# Patient Record
Sex: Female | Born: 1960 | Race: White | Hispanic: No | Smoking: Never smoker
Health system: Southern US, Community
[De-identification: ages and names within clinical notes are randomized; demographics above are authoritative.]

## PROBLEM LIST (undated history)

## (undated) DIAGNOSIS — K259 Gastric ulcer, unspecified as acute or chronic, without hemorrhage or perforation: Secondary | ICD-10-CM

## (undated) DIAGNOSIS — E7800 Pure hypercholesterolemia, unspecified: Secondary | ICD-10-CM

## (undated) HISTORY — PX: CHOLECYSTECTOMY: SHX55

---

## 2017-01-18 ENCOUNTER — Emergency Department (HOSPITAL_BASED_OUTPATIENT_CLINIC_OR_DEPARTMENT_OTHER)
Admission: EM | Admit: 2017-01-18 | Discharge: 2017-01-18 | Disposition: A | Discharge status: Home / Self Care | Payer: 59 | Attending: Emergency Medicine | Admitting: Emergency Medicine

## 2017-01-18 ENCOUNTER — Emergency Department (HOSPITAL_BASED_OUTPATIENT_CLINIC_OR_DEPARTMENT_OTHER): Payer: 59

## 2017-01-18 ENCOUNTER — Encounter (HOSPITAL_BASED_OUTPATIENT_CLINIC_OR_DEPARTMENT_OTHER): Payer: Self-pay | Admitting: Emergency Medicine

## 2017-01-18 DIAGNOSIS — R42 Dizziness and giddiness: Secondary | ICD-10-CM | POA: Diagnosis not present

## 2017-01-18 DIAGNOSIS — R110 Nausea: Secondary | ICD-10-CM | POA: Insufficient documentation

## 2017-01-18 DIAGNOSIS — R079 Chest pain, unspecified: Secondary | ICD-10-CM | POA: Diagnosis not present

## 2017-01-18 DIAGNOSIS — R1013 Epigastric pain: Secondary | ICD-10-CM

## 2017-01-18 HISTORY — DX: Pure hypercholesterolemia, unspecified: E78.00

## 2017-01-18 HISTORY — DX: Gastric ulcer, unspecified as acute or chronic, without hemorrhage or perforation: K25.9

## 2017-01-18 LAB — LIPASE, BLOOD: Lipase: 21 U/L (ref 11–51)

## 2017-01-18 LAB — COMPREHENSIVE METABOLIC PANEL
ALBUMIN: 3.8 g/dL (ref 3.5–5.0)
ALT: 32 U/L (ref 14–54)
ANION GAP: 12 (ref 5–15)
AST: 46 U/L — ABNORMAL HIGH (ref 15–41)
Alkaline Phosphatase: 91 U/L (ref 38–126)
BUN: 13 mg/dL (ref 6–20)
CHLORIDE: 106 mmol/L (ref 101–111)
CO2: 19 mmol/L — AB (ref 22–32)
Calcium: 8.9 mg/dL (ref 8.9–10.3)
Creatinine, Ser: 0.74 mg/dL (ref 0.44–1.00)
GFR calc Af Amer: >60 mL/min (ref >60)
GFR calc non Af Amer: >60 mL/min (ref >60)
Glucose, Bld: 160 mg/dL — ABNORMAL HIGH (ref 65–99)
POTASSIUM: 2.9 mmol/L — AB (ref 3.5–5.1)
SODIUM: 137 mmol/L (ref 135–145)
Total Bilirubin: 0.2 mg/dL — ABNORMAL LOW (ref 0.3–1.2)
Total Protein: 6.8 g/dL (ref 6.5–8.1)

## 2017-01-18 LAB — CBC WITH DIFFERENTIAL/PLATELET
BASOS PCT: 0 %
Basophils Absolute: 0 10*3/uL (ref 0.0–0.1)
EOS ABS: 0.4 10*3/uL (ref 0.0–0.7)
EOS PCT: 5 %
HCT: 38.2 % (ref 36.0–46.0)
Hemoglobin: 12.7 g/dL (ref 12.0–15.0)
LYMPHS ABS: 1.9 10*3/uL (ref 0.7–4.0)
Lymphocytes Relative: 24 %
MCH: 29.2 pg (ref 26.0–34.0)
MCHC: 33.2 g/dL (ref 30.0–36.0)
MCV: 87.8 fL (ref 78.0–100.0)
MONOS PCT: 15 %
Monocytes Absolute: 1.2 10*3/uL — ABNORMAL HIGH (ref 0.1–1.0)
Neutro Abs: 4.4 10*3/uL (ref 1.7–7.7)
Neutrophils Relative %: 56 %
PLATELETS: 302 10*3/uL (ref 150–400)
RBC: 4.35 MIL/uL (ref 3.87–5.11)
RDW: 13.8 % (ref 11.5–15.5)
WBC: 8 10*3/uL (ref 4.0–10.5)

## 2017-01-18 LAB — TROPONIN I

## 2017-01-18 MED ORDER — POTASSIUM CHLORIDE ER 10 MEQ PO TBCR: 20.0000 meq | EXTENDED_RELEASE_TABLET | Freq: Every day | ORAL | 0 refills | Status: AC

## 2017-01-18 MED ORDER — GI COCKTAIL ~~LOC~~
30.0000 mL | Freq: Once | ORAL | Status: AC
  Administered 2017-01-18: 30 mL via ORAL
  Filled 2017-01-18: qty 30

## 2017-01-18 MED ORDER — PANTOPRAZOLE SODIUM 20 MG PO TBEC: 40.0000 mg | DELAYED_RELEASE_TABLET | Freq: Two times a day (BID) | ORAL | 0 refills | Status: AC

## 2017-01-18 MED ORDER — POTASSIUM CHLORIDE CRYS ER 20 MEQ PO TBCR
40.0000 meq | EXTENDED_RELEASE_TABLET | Freq: Once | ORAL | Status: AC
  Administered 2017-01-18: 40 meq via ORAL
  Filled 2017-01-18: qty 2

## 2017-01-18 NOTE — ED Notes (Signed)
Pt helped into wheelchair. Able to stand and pivot to wheelchair unassisted. Pt sweaty and holding her epigastric area, breathing heavily and rapidly. C/o pain

## 2017-01-18 NOTE — ED Notes (Signed)
ED Provider at bedside. 

## 2017-01-18 NOTE — ED Notes (Signed)
Patient denies pain and is resting comfortably.  

## 2017-01-18 NOTE — ED Notes (Signed)
Calmer, family at bedside

## 2017-01-18 NOTE — ED Triage Notes (Signed)
Epigastric pain since Thursday, denies n/v or diarrhea

## 2017-01-18 NOTE — ED Notes (Signed)
Patient transported to X-ray 

## 2017-01-18 NOTE — ED Provider Notes (Signed)
[facility]   CSN: [id number] Arrival date & time: 01/18/17  [id number]     History   Chief Complaint Chief Complaint  Patient presents with  . Abdominal Pain    HPI Julie Burke is a 56 y.o. female.  HPI   56yo female with history of hyperlipidemia, cholecystectomy, prior PUD, presents with concern for epigastric pain.  Reports pain began this morning at 430AM, sharp, epigastric and lower chest pain. No radiation.  Severe pain. Father had MI in his 7860s. No hx of smoking.  Nothing makes pain better or worse Not exertional. No shortness of breath. Nausea but no vomiting.  No diarrhea.  Last BM was Thursday. Not sure if pain related to gas, reports fear it is related to heart.   Past Medical History:  Diagnosis Date  . High cholesterol   . Stomach ulcer     There are no active problems to display for this patient.   Past Surgical History:  Procedure Laterality Date  . CHOLECYSTECTOMY      OB History    No data available       Home Medications    Prior to Admission medications   Medication Sig Start Date End Date Taking? Authorizing Provider  atorvastatin (LIPITOR) 40 MG tablet Take 40 mg by mouth daily.   Yes [provider]  pantoprazole (PROTONIX) 20 MG tablet Take 2 tablets (40 mg total) by mouth 2 (two) times daily. 01/18/17 02/01/17  Alvira MondaySchlossman, Gayna Braddy, MD  potassium chloride (K-DUR) 10 MEQ tablet Take 2 tablets (20 mEq total) by mouth daily. 01/18/17 01/21/17  Alvira MondaySchlossman, Cerina Leary, MD    Family History No family history on file.  Social History Social History  Substance Use Topics  . Smoking status: Never Smoker  . Smokeless tobacco: Never Used  . Alcohol use Yes     Comment: social     Allergies   Sulfa antibiotics   Review of Systems Review of Systems  Constitutional: Negative for fever. Diaphoresis: felt clammy.  HENT: Negative for sore throat.   Eyes: Negative for visual disturbance.  Respiratory: Negative for cough  and shortness of breath.   Cardiovascular: Positive for chest pain.  Gastrointestinal: Positive for abdominal pain and nausea. Negative for vomiting. Constipation: last BM Thursday.  Genitourinary: Negative for difficulty urinating.  Musculoskeletal: Negative for back pain and neck pain.  Skin: Negative for rash.  Neurological: Positive for light-headedness. Negative for syncope and headaches.     Physical Exam Updated Vital Signs BP 108/76   Pulse 70   Temp (!) 96.5 F (35.8 C) (Tympanic)   Resp 19   Ht 5\' 2"  (1.575 m)   Wt 182 lb (82.6 kg)   SpO2 100%   BMI 33.29 kg/m   Physical Exam  Constitutional: She is oriented to person, place, and time. She appears well-developed and well-nourished. No distress.  Anxious, tearful  HENT:  Head: Normocephalic and atraumatic.  Eyes: Conjunctivae and EOM are normal.  Neck: Normal range of motion.  Cardiovascular: Normal rate, regular rhythm, normal heart sounds and intact distal pulses.  Exam reveals no gallop and no friction rub.   No murmur heard. Pulmonary/Chest: Effort normal and breath sounds normal. No respiratory distress. She has no wheezes. She has no rales.  Abdominal: Soft. She exhibits no distension. There is tenderness (acknowledges epigastric tenderness when asked, otherwise no significant tenderness, no guarding). There is no guarding and no tenderness at McBurney's point.  Musculoskeletal: She exhibits no edema or tenderness.  Neurological: She is alert and oriented to person, place, and time.  Skin: Skin is warm and dry. No rash noted. She is not diaphoretic. No erythema.  Nursing note and vitals reviewed.    ED Treatments / Results  Labs (all labs ordered are listed, but only abnormal results are displayed) Labs Reviewed  CBC WITH DIFFERENTIAL/PLATELET - Abnormal; Notable for the following:       Result Value   Monocytes Absolute 1.2 (*)    All other components within normal limits  COMPREHENSIVE METABOLIC PANEL  - Abnormal; Notable for the following:    Potassium 2.9 (*)    CO2 19 (*)    Glucose, Bld 160 (*)    AST 46 (*)    Total Bilirubin 0.2 (*)    All other components within normal limits  LIPASE, BLOOD  TROPONIN I  TROPONIN I    EKG  EKG Interpretation  Date/Time:  Saturday Jan 18 2017 07:34:51 EDT Ventricular Rate:  84 PR Interval:    QRS Duration: 102 QT Interval:  419 QTC Calculation: 496 R Axis:   90 Text Interpretation:  Sinus rhythm Borderline right axis deviation Borderline repolarization abnormality Borderline prolonged QT interval No previous ECGs available Confirmed by [facility] MD, Ying Blankenhorn ([id number]) on 01/18/2017 7:37:01 AM       Radiology Dg Chest 2 View  Result Date: 01/18/2017 CLINICAL DATA:  56 year old female with lower chest and epigastric pain since Thursday. EXAM: CHEST  2 VIEW COMPARISON:  None. FINDINGS: The lungs are clear and negative for focal airspace consolidation, pulmonary edema or suspicious pulmonary nodule. No pleural effusion or pneumothorax. Cardiac and mediastinal contours are within normal limits. No acute fracture or lytic or blastic osseous lesions. The visualized upper abdominal bowel gas pattern is unremarkable. Surgical clips in the right upper quadrant suggest prior cholecystectomy. IMPRESSION: Negative chest x-ray. Electronically Signed   By: Malachy Moan M.D.   On: 01/18/2017 08:14    Procedures Procedures (including critical care time)  Medications Ordered in ED Medications  gi cocktail (Maalox,Lidocaine,Donnatal) (30 mLs Oral Given 01/18/17 0803)  potassium chloride SA (K-DUR,KLOR-CON) CR tablet 40 mEq (40 mEq Oral Given 01/18/17 0830)     Initial Impression / Assessment and Plan / ED Course  I have reviewed the triage vital signs and the nursing notes.  Pertinent labs & imaging results that were available during my care of the patient were reviewed by me and considered in my medical decision making (see chart for details).      56yo female with history of hyperlipidemia, cholecystectomy, prior PUD, presents with concern for epigastric pain. EKG with nonspecific repolarization abnormality.  Abdominal exam benign, doubt perforated ulcer, cholangitis, appendicitis. No associated dyspnea, location not consistent with PE, and no estrogen use/recent travel/leg pain or swelling. Equal pulses bilaterally, doubt dissection.    DDx includes pancreatitis, hepatitis, peptic ulcer disease, gastritis, constipation, myocardial ischemia. Given GI cocktail with improvement in symptoms. Chest XR WNL. Lipase WNL.  HEART score 3 and troponin x2 negative.  Given location, hx of prior PUD, improvement with GI cocktail overall suspect PUD or gastritis as most likely. Given family hx of CAD feel outpt cardiology evaluation is appropriate  Given rx for protonix, recommend PCP follow up.  Final Clinical Impressions(s) / ED Diagnoses   Final diagnoses:  Epigastric pain  Chest pain, unspecified type    New Prescriptions Discharge Medication List as of 01/18/2017 11:31 AM    START taking these medications   Details  pantoprazole (PROTONIX) 20  MG tablet Take 2 tablets (40 mg total) by mouth 2 (two) times daily., Starting Sat 01/18/2017, Until Sat 02/01/2017, Print         Alvira Monday, MD 01/18/17 2021

## 2017-01-18 NOTE — ED Notes (Signed)
Pt able to stand and pivot to lowered bed from wheelchair. RT in to listen to pt. Pt helped into hospital gown . RN called to room.

## 2021-11-08 ENCOUNTER — Emergency Department (HOSPITAL_BASED_OUTPATIENT_CLINIC_OR_DEPARTMENT_OTHER): Payer: BLUE CROSS/BLUE SHIELD

## 2021-11-08 ENCOUNTER — Encounter (HOSPITAL_BASED_OUTPATIENT_CLINIC_OR_DEPARTMENT_OTHER): Payer: Self-pay | Admitting: Emergency Medicine

## 2021-11-08 ENCOUNTER — Emergency Department (HOSPITAL_BASED_OUTPATIENT_CLINIC_OR_DEPARTMENT_OTHER)
Admission: EM | Admit: 2021-11-08 | Discharge: 2021-11-08 | Disposition: A | Discharge status: Home / Self Care | Payer: BLUE CROSS/BLUE SHIELD | Attending: Emergency Medicine | Admitting: Emergency Medicine

## 2021-11-08 ENCOUNTER — Other Ambulatory Visit: Payer: Self-pay

## 2021-11-08 DIAGNOSIS — M5136 Other intervertebral disc degeneration, lumbar region: Secondary | ICD-10-CM

## 2021-11-08 DIAGNOSIS — M5186 Other intervertebral disc disorders, lumbar region: Secondary | ICD-10-CM | POA: Diagnosis not present

## 2021-11-08 DIAGNOSIS — R739 Hyperglycemia, unspecified: Secondary | ICD-10-CM | POA: Diagnosis not present

## 2021-11-08 DIAGNOSIS — E876 Hypokalemia: Secondary | ICD-10-CM | POA: Diagnosis not present

## 2021-11-08 DIAGNOSIS — M549 Dorsalgia, unspecified: Secondary | ICD-10-CM | POA: Diagnosis present

## 2021-11-08 DIAGNOSIS — K219 Gastro-esophageal reflux disease without esophagitis: Secondary | ICD-10-CM | POA: Insufficient documentation

## 2021-11-08 LAB — CBC WITH DIFFERENTIAL/PLATELET
Abs Immature Granulocytes: 0.02 10*3/uL (ref 0.00–0.07)
Basophils Absolute: 0 10*3/uL (ref 0.0–0.1)
Basophils Relative: 1 %
Eosinophils Absolute: 0.2 10*3/uL (ref 0.0–0.5)
Eosinophils Relative: 3 %
HCT: 37.5 % (ref 36.0–46.0)
Hemoglobin: 12.1 g/dL (ref 12.0–15.0)
Immature Granulocytes: 0 %
Lymphocytes Relative: 19 %
Lymphs Abs: 1.5 10*3/uL (ref 0.7–4.0)
MCH: 29 pg (ref 26.0–34.0)
MCHC: 32.3 g/dL (ref 30.0–36.0)
MCV: 89.9 fL (ref 80.0–100.0)
Monocytes Absolute: 0.9 10*3/uL (ref 0.1–1.0)
Monocytes Relative: 12 %
Neutro Abs: 5.2 10*3/uL (ref 1.7–7.7)
Neutrophils Relative %: 65 %
Platelets: 304 10*3/uL (ref 150–400)
RBC: 4.17 MIL/uL (ref 3.87–5.11)
RDW: 13.3 % (ref 11.5–15.5)
WBC: 8 10*3/uL (ref 4.0–10.5)
nRBC: 0 % (ref 0.0–0.2)

## 2021-11-08 LAB — COMPREHENSIVE METABOLIC PANEL
ALT: 21 U/L (ref 0–44)
AST: 25 U/L (ref 15–41)
Albumin: 3.9 g/dL (ref 3.5–5.0)
Alkaline Phosphatase: 91 U/L (ref 38–126)
Anion gap: 8 (ref 5–15)
BUN: 15 mg/dL (ref 6–20)
CO2: 25 mmol/L (ref 22–32)
Calcium: 8.9 mg/dL (ref 8.9–10.3)
Chloride: 106 mmol/L (ref 98–111)
Creatinine, Ser: 0.82 mg/dL (ref 0.44–1.00)
GFR, Estimated: >60 mL/min (ref >60)
Glucose, Bld: 108 mg/dL — ABNORMAL HIGH (ref 70–99)
Potassium: 3.3 mmol/L — ABNORMAL LOW (ref 3.5–5.1)
Sodium: 139 mmol/L (ref 135–145)
Total Bilirubin: 0.6 mg/dL (ref 0.3–1.2)
Total Protein: 7.2 g/dL (ref 6.5–8.1)

## 2021-11-08 LAB — URINALYSIS, ROUTINE W REFLEX MICROSCOPIC
Bilirubin Urine: NEGATIVE
Glucose, UA: NEGATIVE mg/dL
Hgb urine dipstick: NEGATIVE
Ketones, ur: NEGATIVE mg/dL
Leukocytes,Ua: NEGATIVE
Nitrite: NEGATIVE
Protein, ur: NEGATIVE mg/dL
Specific Gravity, Urine: 1.01 (ref 1.005–1.030)
pH: 5.5 (ref 5.0–8.0)

## 2021-11-08 LAB — LIPASE, BLOOD: Lipase: 34 U/L (ref 11–51)

## 2021-11-08 LAB — MAGNESIUM: Magnesium: 2 mg/dL (ref 1.7–2.4)

## 2021-11-08 LAB — CBG MONITORING, ED: Glucose-Capillary: 132 mg/dL — ABNORMAL HIGH (ref 70–99)

## 2021-11-08 LAB — TROPONIN I (HIGH SENSITIVITY): Troponin I (High Sensitivity): 5 ng/L (ref <18)

## 2021-11-08 MED ORDER — METHOCARBAMOL 500 MG PO TABS: 500.0000 mg | ORAL_TABLET | Freq: Two times a day (BID) | ORAL | 0 refills | Status: AC

## 2021-11-08 MED ORDER — POTASSIUM CHLORIDE CRYS ER 20 MEQ PO TBCR
40.0000 meq | EXTENDED_RELEASE_TABLET | Freq: Once | ORAL | Status: AC
  Administered 2021-11-08: 40 meq via ORAL
  Filled 2021-11-08: qty 2

## 2021-11-08 NOTE — ED Notes (Signed)
Patient transported to X-ray 

## 2021-11-08 NOTE — Discharge Instructions (Addendum)
Take the medication as needed for your symptoms.  ?Follow up with your primary care provider. ?Return to the ED if you start to experience worsening symptoms, increase in pain, numbness, weakness, shortness of breath. ?Keep an eye out for a rash in the area of your back pain. ?

## 2021-11-08 NOTE — ED Triage Notes (Signed)
Pt states back pain and right foot feels tingling, woke up about 0100 3/2 with indigestion and sweating, took a Pepcid to feel better. States under more stress than usual . ?

## 2021-11-08 NOTE — ED Notes (Signed)
Patient discharged to home.  All discharge instructions reviewed.  Patient verbalized understanding via teachback method.  VS WDL.  Respirations even and unlabored.  Ambulatory out of ED.   °

## 2021-11-08 NOTE — ED Notes (Signed)
Patient ambulatory to bathroom.

## 2021-11-08 NOTE — ED Notes (Signed)
Urine specimen requested from patient.  ?

## 2021-11-08 NOTE — ED Provider Notes (Signed)
[facility] Provider Note   CSN: [id number] Arrival date & time: 11/08/21  2116     History  Chief Complaint  Patient presents with   Back Pain    Julie Burke is a 61 y.o. female with a past medical history of reflux, hyperlipidemia presenting to the ED with multiple complaints. #1 at approximately 1 AM today started experiencing indigestion, nausea and diaphoresis.  This improved with laying down and taking Pepcid.  She went to sleep and woke up but did not feel "100% normal."  She denies any classic chest pain, shortness of breath or cough.  She does note that when she has had bouts of indigestion in the past she does not typically have nausea or diaphoresis with it #2 she reports right sided back pain that is worse with movement.  No injury or trauma. #3 she reports paresthesias in bilateral lower extremities, right greater than left.  These are intermittent without specific aggravating or alleviating factor.  She does not feel that her extremities are swollen.  Denies any abdominal pain, vomiting, diarrhea.  She had her annual physical exam yesterday with her PCP and was given a tetanus booster.  HPI     Home Medications Prior to Admission medications   Medication Sig Start Date End Date Taking? Authorizing Provider  methocarbamol (ROBAXIN) 500 MG tablet Take 1 tablet (500 mg total) by mouth 2 (two) times daily. 11/08/21  Yes Sharonda Llamas, PA-C  atorvastatin (LIPITOR) 40 MG tablet Take 40 mg by mouth daily.    [provider]  pantoprazole (PROTONIX) 20 MG tablet Take 2 tablets (40 mg total) by mouth 2 (two) times daily. 01/18/17 02/01/17  Gareth Morgan, MD  potassium chloride (K-DUR) 10 MEQ tablet Take 2 tablets (20 mEq total) by mouth daily. 01/18/17 01/21/17  Gareth Morgan, MD      Allergies    Sulfa antibiotics    Review of Systems   Review of Systems  Constitutional:  Positive for diaphoresis. Negative for appetite change,  chills and fever.  HENT:  Negative for ear pain, rhinorrhea, sneezing and sore throat.   Eyes:  Negative for photophobia and visual disturbance.  Respiratory:  Negative for cough, chest tightness, shortness of breath and wheezing.   Cardiovascular:  Negative for chest pain and palpitations.  Gastrointestinal:  Positive for nausea. Negative for abdominal pain, blood in stool, constipation, diarrhea and vomiting.       +burning sensation in chest  Genitourinary:  Negative for dysuria, hematuria and urgency.  Musculoskeletal:  Positive for back pain. Negative for myalgias.  Skin:  Negative for rash.  Neurological:  Negative for dizziness, weakness and light-headedness.       +paresthesias in BLE   Physical Exam Updated Vital Signs BP (!) 142/73    Pulse 79    Temp 97.8 F (36.6 C) (Oral)    Resp 17    Ht 5\' 2"  (1.575 m)    Wt 86.2 kg    SpO2 99%    BMI 34.75 kg/m  Physical Exam Vitals and nursing note reviewed.  Constitutional:      General: She is not in acute distress.    Appearance: She is well-developed.  HENT:     Head: Normocephalic and atraumatic.     Nose: Nose normal.  Eyes:     General: No scleral icterus.       Right eye: No discharge.        Left eye: No discharge.  Conjunctiva/sclera: Conjunctivae normal.  Cardiovascular:     Rate and Rhythm: Normal rate and regular rhythm.     Heart sounds: Normal heart sounds. No murmur heard.   No friction rub. No gallop.  Pulmonary:     Effort: Pulmonary effort is normal. No respiratory distress.     Breath sounds: Normal breath sounds.  Abdominal:     General: Bowel sounds are normal. There is no distension.     Palpations: Abdomen is soft.     Tenderness: There is no abdominal tenderness. There is no right CVA tenderness, left CVA tenderness or guarding.     Comments: No CVA tenderness bilaterally.  Musculoskeletal:        General: Normal range of motion.     Cervical back: Normal range of motion and neck supple.      Comments: 2+ DP pulse palpated bilaterally.  No edema noted to bilateral lower extremities.  Normal sensation to light touch of bilateral lower extremities.  No calf tenderness bilaterally.  Strength 5/5 in bilateral upper and lower extremities.  Skin:    General: Skin is warm and dry.     Findings: No rash.  Neurological:     Mental Status: She is alert.     Motor: No abnormal muscle tone.     Coordination: Coordination normal.    ED Results / Procedures / Treatments   Labs (all labs ordered are listed, but only abnormal results are displayed) Labs Reviewed  COMPREHENSIVE METABOLIC PANEL - Abnormal; Notable for the following components:      Result Value   Potassium 3.3 (*)    Glucose, Bld 108 (*)    All other components within normal limits  CBG MONITORING, ED - Abnormal; Notable for the following components:   Glucose-Capillary 132 (*)    All other components within normal limits  LIPASE, BLOOD  CBC WITH DIFFERENTIAL/PLATELET  MAGNESIUM  URINALYSIS, ROUTINE W REFLEX MICROSCOPIC  TROPONIN I (HIGH SENSITIVITY)    EKG EKG Interpretation  Date/Time:  Thursday November 08 2021 21:55:07 EST Ventricular Rate:  76 PR Interval:  141 QRS Duration: 99 QT Interval:  387 QTC Calculation: 436 R Axis:   72 Text Interpretation: Sinus rhythm Atrial premature complex Probable inferior infarct, age indeterminate Similar to 2018 tracing Confirmed by Nanda Quinton [phone]) on 11/08/2021 10:48:20 PM  Radiology DG Chest 2 View  Result Date: 11/08/2021 CLINICAL DATA:  Back pain, diaphoresis, indigestion EXAM: CHEST - 2 VIEW COMPARISON:  11/12/2017 FINDINGS: Frontal and lateral views of the chest demonstrate an unremarkable cardiac silhouette. No airspace disease, effusion, or pneumothorax. No acute bony abnormalities. IMPRESSION: 1. No acute intrathoracic process. Electronically Signed   By: Randa Ngo M.D.   On: 11/08/2021 22:15   DG Lumbar Spine Complete  Result Date: 11/08/2021 CLINICAL  DATA:  Back pain right foot paresthesia EXAM: LUMBAR SPINE - COMPLETE 4+ VIEW COMPARISON:  None. FINDINGS: There is grade 1 anterolisthesis of L5-S1 with associated intervertebral disc space narrowing. Minimal retrolisthesis of L1-2 and L3-4 is likely degenerative in nature. No acute fracture of the lumbar spine. Vertebral body height is preserved. There is intervertebral disc space narrowing and endplate remodeling at 075-GRM, most severe at L1-2 in keeping with changes of mild to moderate degenerative disc disease. Oblique views do not optimally profiled the L5 pars interarticularis, however, bilateral pars defects are suspected. The paraspinal soft tissues are unremarkable. IMPRESSION: Degenerative disc disease throughout the lumbar spine with associated grade 1 anterolisthesis. Suspected bilateral L5 pars defects. Electronically  Signed   By: Fidela Salisbury M.D.   On: 11/08/2021 23:09    Procedures Procedures    Medications Ordered in ED Medications  potassium chloride SA (KLOR-CON M) CR tablet 40 mEq (40 mEq Oral Given 11/08/21 2302)    ED Course/ Medical Decision Making/ A&P Clinical Course as of 11/08/21 2330  Thu Nov 08, 2021  2158 Glucose-Capillary(!): 132 [HK]  2218 WBC: 8.0 [HK]  2218 Hemoglobin: 12.1 [HK]  2221 Potassium(!): 3.3 [HK]  2237 Magnesium: 2.0 [HK]  2243 Troponin I (High Sensitivity): 5 [HK]  2257 Leukocytes,Ua: NEGATIVE [HK]  2257 Nitrite: NEGATIVE [HK]    Clinical Course User Index [HK] Delia Heady, PA-C                           Medical Decision Making Amount and/or Complexity of Data Reviewed Labs: ordered. Decision-making details documented in ED Course. Radiology: ordered.   61 year old female with past medical history of GERD, hyperlipidemia presenting to the ED for multiple complaints.  Early this morning she woke up and felt a burning sensation in her chest, became diaphoretic.  This resolved with laying down and taking Pepcid.  She also reports  right-sided back pain and paresthesias in bilateral lower extremities, right greater than left.  This began today.  She does endorse increased stress in her life which she is unsure could be related to these symptoms.  On exam lungs are clear to auscultation bilaterally.  Palpation of the back reveals no tenderness in the midline or paraspinal musculature.  Lower extremities are neurovascularly intact with equal intact distal pulses bilaterally.  She has no neurological deficits on exam.  Will obtain lab work, EKG, chest x-ray and reassess.  EKG shows sinus rhythm, no changes from prior tracings.  Chest x-ray shows no acute findings.  Lab work ordered interpreted by me.  Unremarkable CBC, magnesium level.  Troponin is negative.  Potassium slightly low at 3.3 and this was repleted orally.  Lumbar x-ray done due to new symptom of back pain and shows degenerative changes.  This could be causing the paresthesias in her lower extremities. Doubt her symptoms are due to dissection, PE, CAD or other emergent cause based on her reassuring workup and physical exam findings.  There is no rash noted on the area of her back where she is experiencing pain but I have advised her to look out for this and considered shingles although no rash present at this time. She remains ambulatory here and hemodynamically stable. She is requesting discharge home and I feel that this is reasonable given her reassuring work-up today.  She knows to return for any worsening symptoms.  All imaging, if done today, including plain films, CT scans, and ultrasounds, independently reviewed by me, and interpretations confirmed via formal radiology reads.  Patient is hemodynamically stable, in NAD, and able to ambulate in the ED. Evaluation does not show pathology that would require ongoing emergent intervention or inpatient treatment. I explained the diagnosis to the patient. Pain has been managed and has no complaints prior to discharge. Patient is  comfortable with above plan and is stable for discharge at this time. All questions were answered prior to disposition. Strict return precautions for returning to the ED were discussed. Encouraged follow up with PCP.   An After Visit Summary was printed and given to the patient.   Portions of this note were generated with [employer]. Dictation errors may occur despite best attempts  at proofreading.         Final Clinical Impression(s) / ED Diagnoses Final diagnoses:  Gastroesophageal reflux disease, unspecified whether esophagitis present  Degenerative disc disease, lumbar    Rx / DC Orders ED Discharge Orders          Ordered    methocarbamol (ROBAXIN) 500 MG tablet  2 times daily        11/08/21 2324              Delia Heady, PA-C 11/08/21 2330    Margette Fast, MD 11/16/21 [phone]

## 2023-07-09 IMAGING — DX DG LUMBAR SPINE COMPLETE 4+V
5 series · 5 of 5 positions shown · non-contrast
Comparison: None.

CLINICAL DATA: Back pain right foot paresthesia

EXAM:
LUMBAR SPINE - COMPLETE 4+ VIEW

[l-spine ap]
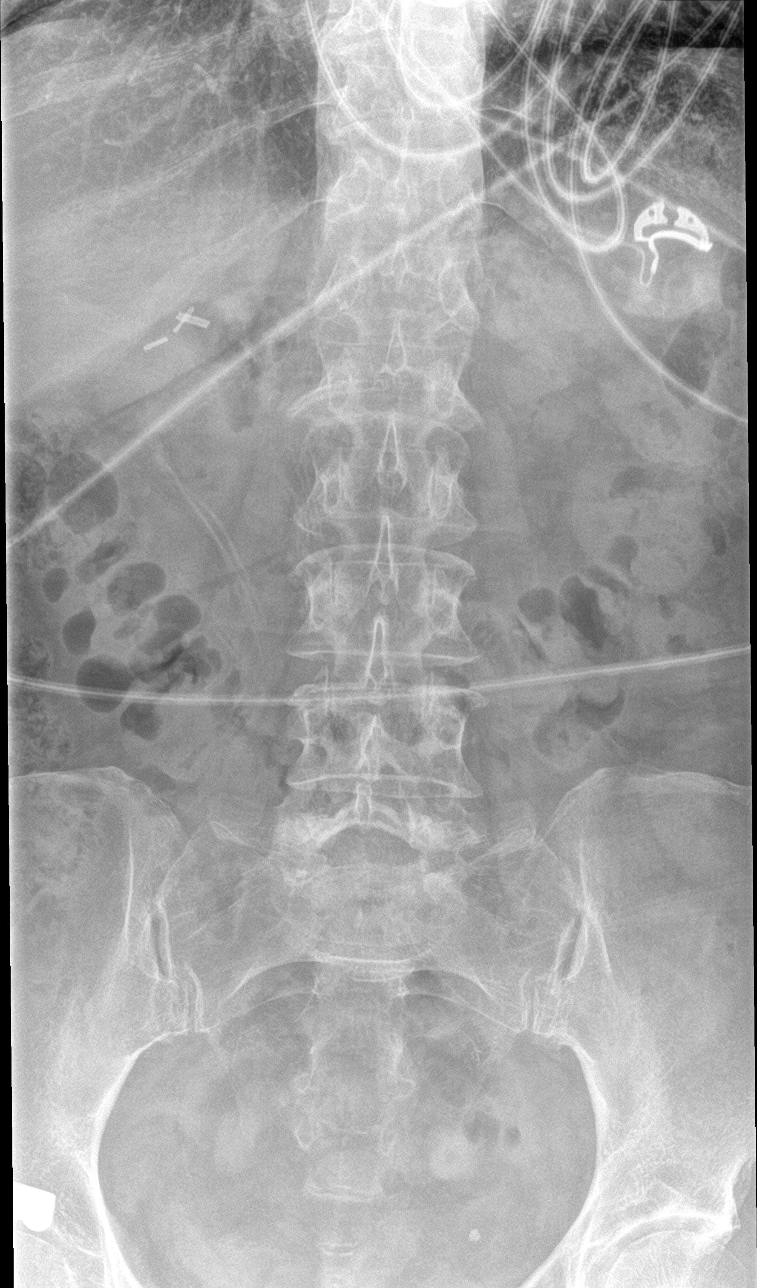

[l-spine obl (1 of 2)]
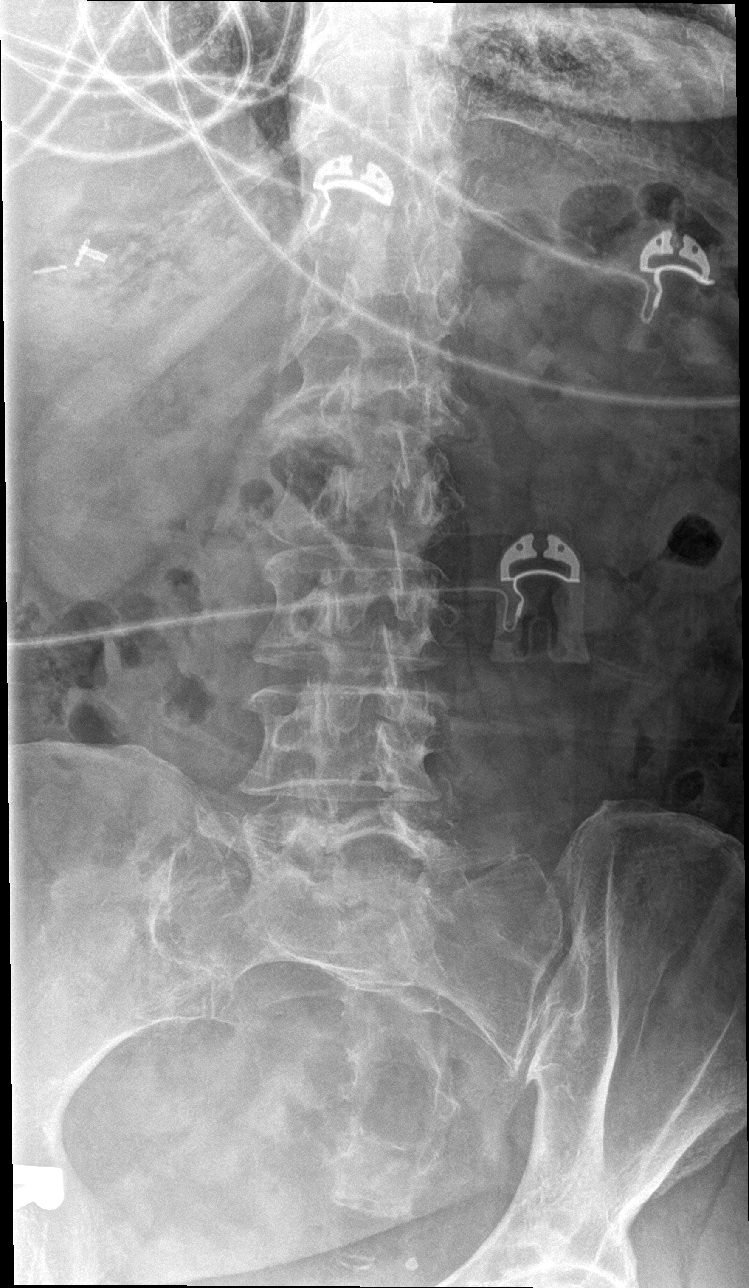

[l-spine obl (2 of 2)]
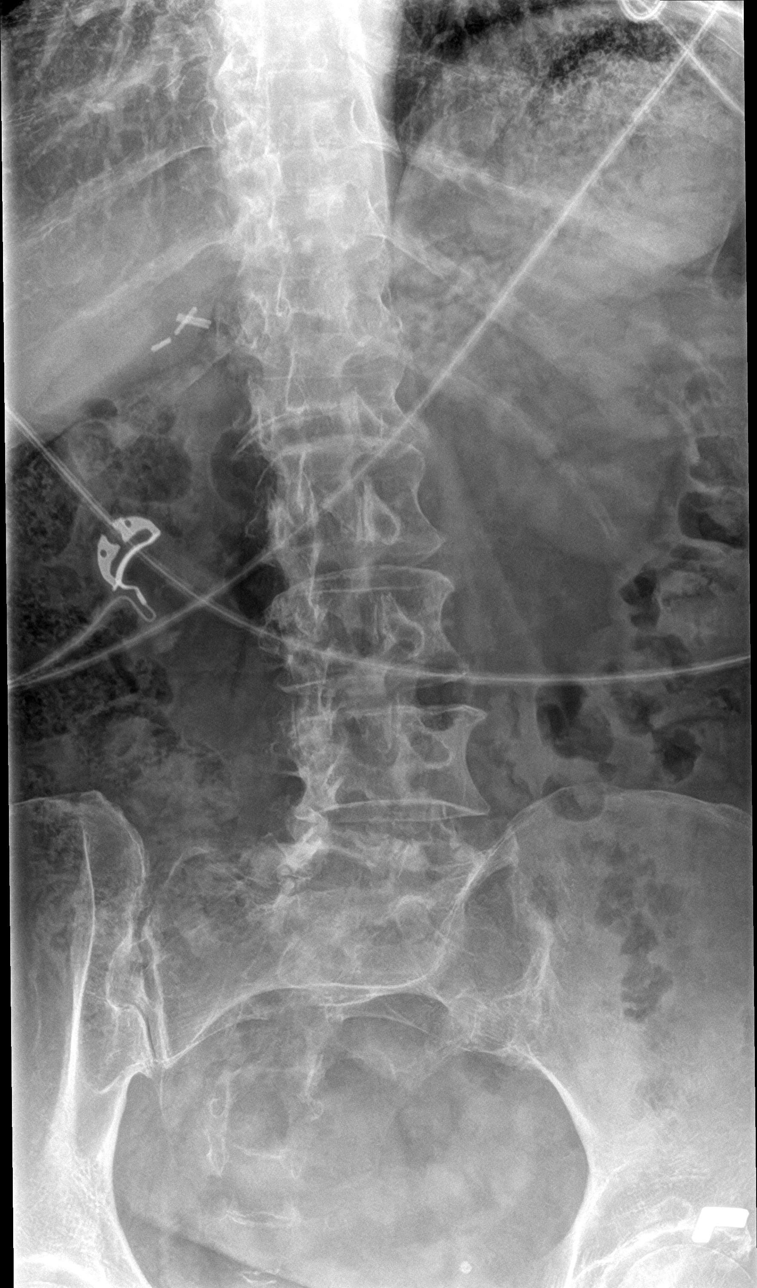

[l-spine lat]
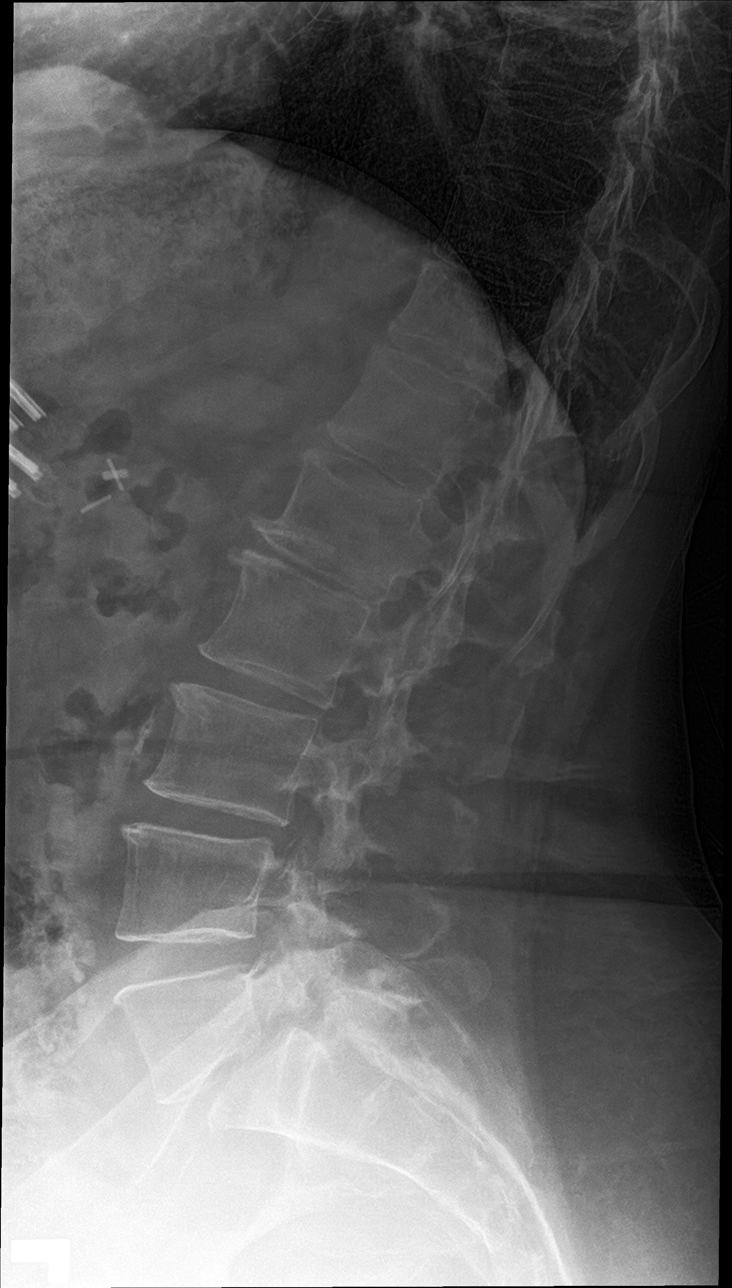

[l-spine spot]
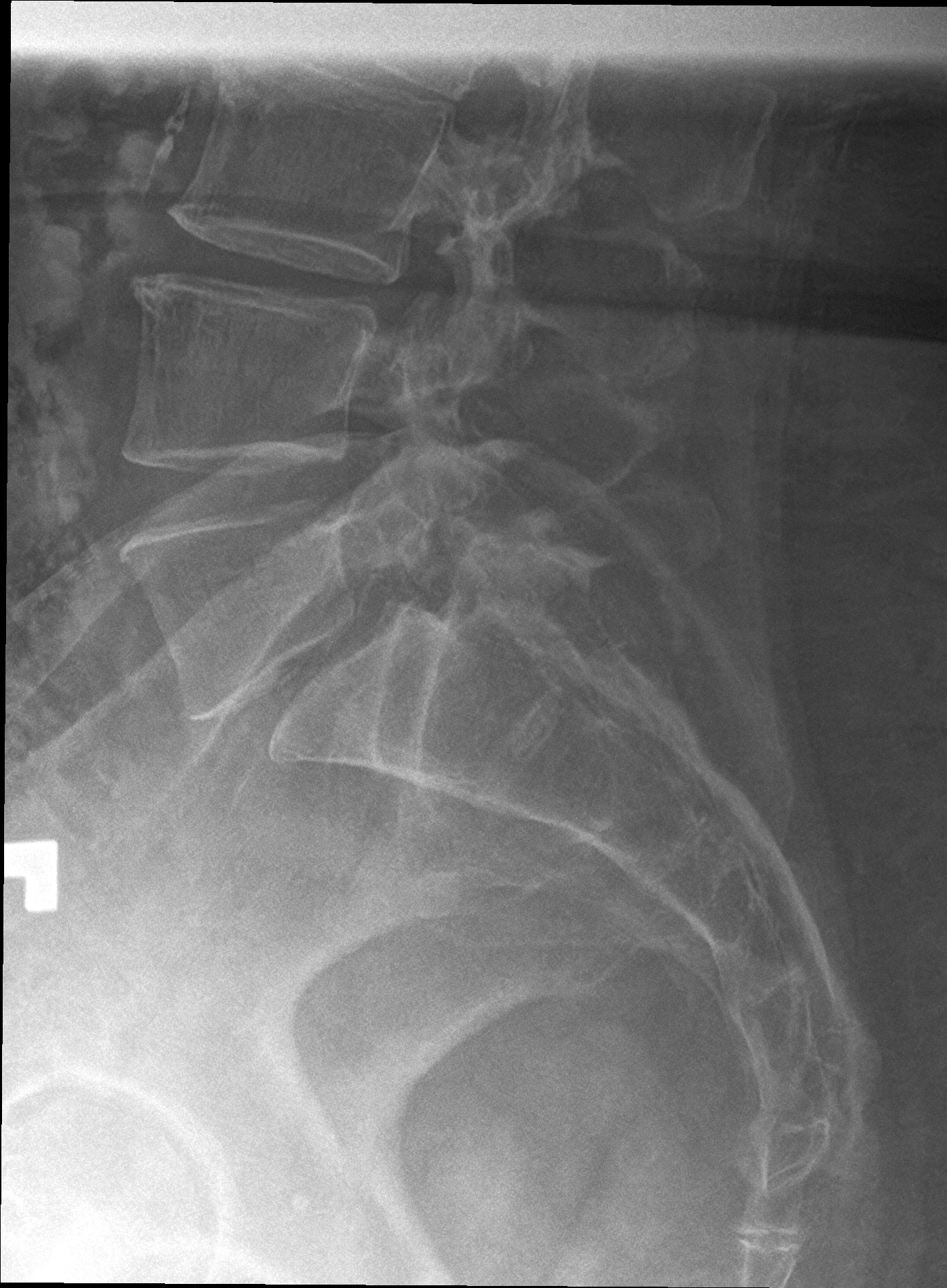

[5 of 5 positions shown; findings below may reference images not displayed]

FINDINGS: There is grade 1 anterolisthesis of L5-S1 with associated
intervertebral disc space narrowing. Minimal retrolisthesis of L1-2
and L3-4 is likely degenerative in nature. No acute fracture of the
lumbar spine. Vertebral body height is preserved. There is
intervertebral disc space narrowing and endplate remodeling at
L1-L4, most severe at L1-2 in keeping with changes of mild to
moderate degenerative disc disease. Oblique views do not optimally
profiled the L5 pars interarticularis, however, bilateral pars
defects are suspected. The paraspinal soft tissues are
unremarkable.
IMPRESSION: Degenerative disc disease throughout the lumbar spine with
associated grade 1 anterolisthesis. Suspected bilateral L5 pars
defects.
